# Patient Record
Sex: Male | Born: 1955 | Race: White | Hispanic: No | Marital: Single | Smoking: Never smoker
Health system: Southern US, Community
[De-identification: ages and names within clinical notes are randomized; demographics above are authoritative.]

## PROBLEM LIST (undated history)

## (undated) DIAGNOSIS — N289 Disorder of kidney and ureter, unspecified: Secondary | ICD-10-CM

## (undated) HISTORY — PX: CARDIAC SURGERY: SHX584

---

## 1998-10-16 ENCOUNTER — Ambulatory Visit (HOSPITAL_BASED_OUTPATIENT_CLINIC_OR_DEPARTMENT_OTHER): Admission: RE | Admit: 1998-10-16 | Discharge: 1998-10-16 | Payer: Self-pay | Admitting: Surgery

## 2004-10-20 ENCOUNTER — Emergency Department (HOSPITAL_COMMUNITY): Admission: EM | Admit: 2004-10-20 | Discharge: 2004-10-21 | Payer: Self-pay | Admitting: Emergency Medicine

## 2005-04-12 ENCOUNTER — Emergency Department (HOSPITAL_COMMUNITY): Admission: EM | Admit: 2005-04-12 | Discharge: 2005-04-13 | Payer: Self-pay | Admitting: Emergency Medicine

## 2006-11-10 ENCOUNTER — Emergency Department (HOSPITAL_COMMUNITY): Admission: EM | Admit: 2006-11-10 | Discharge: 2006-11-10 | Payer: Self-pay | Admitting: Emergency Medicine

## 2006-11-11 ENCOUNTER — Emergency Department (HOSPITAL_COMMUNITY): Admission: EM | Admit: 2006-11-11 | Discharge: 2006-11-11 | Payer: Self-pay | Admitting: Emergency Medicine

## 2006-11-13 ENCOUNTER — Emergency Department (HOSPITAL_COMMUNITY): Admission: EM | Admit: 2006-11-13 | Discharge: 2006-11-13 | Payer: Self-pay | Admitting: Family Medicine

## 2006-11-15 ENCOUNTER — Emergency Department (HOSPITAL_COMMUNITY): Admission: EM | Admit: 2006-11-15 | Discharge: 2006-11-15 | Payer: Self-pay | Admitting: Family Medicine

## 2006-11-21 ENCOUNTER — Emergency Department (HOSPITAL_COMMUNITY): Admission: EM | Admit: 2006-11-21 | Discharge: 2006-11-21 | Payer: Self-pay | Admitting: Family Medicine

## 2007-01-16 ENCOUNTER — Emergency Department (HOSPITAL_COMMUNITY): Admission: EM | Admit: 2007-01-16 | Discharge: 2007-01-16 | Payer: Self-pay | Admitting: Emergency Medicine

## 2007-01-31 ENCOUNTER — Emergency Department (HOSPITAL_COMMUNITY): Admission: EM | Admit: 2007-01-31 | Discharge: 2007-01-31 | Payer: Self-pay | Admitting: *Deleted

## 2008-11-26 ENCOUNTER — Ambulatory Visit (HOSPITAL_COMMUNITY): Admission: RE | Admit: 2008-11-26 | Discharge: 2008-11-26 | Payer: Self-pay | Admitting: General Surgery

## 2009-02-11 ENCOUNTER — Emergency Department (HOSPITAL_COMMUNITY): Admission: EM | Admit: 2009-02-11 | Discharge: 2009-02-11 | Payer: Self-pay | Admitting: General Surgery

## 2010-05-04 ENCOUNTER — Emergency Department (HOSPITAL_COMMUNITY): Admission: EM | Admit: 2010-05-04 | Discharge: 2010-05-04 | Payer: Self-pay | Admitting: Emergency Medicine

## 2011-01-18 LAB — COMPREHENSIVE METABOLIC PANEL
AST: 19 U/L (ref 0–37)
CO2: 29 mEq/L (ref 19–32)
Chloride: 108 mEq/L (ref 96–112)
Creatinine, Ser: 1.15 mg/dL (ref 0.4–1.5)
GFR calc Af Amer: >60 mL/min (ref >60)
GFR calc non Af Amer: >60 mL/min (ref >60)
Glucose, Bld: 103 mg/dL — ABNORMAL HIGH (ref 70–99)
Total Bilirubin: 0.9 mg/dL (ref 0.3–1.2)

## 2011-01-18 LAB — CBC
HCT: 45.7 % (ref 39.0–52.0)
Hemoglobin: 15.2 g/dL (ref 13.0–17.0)
MCHC: 33.2 g/dL (ref 30.0–36.0)
MCV: 100.2 fL — ABNORMAL HIGH (ref 78.0–100.0)
RBC: 4.56 MIL/uL (ref 4.22–5.81)
WBC: 6.1 10*3/uL (ref 4.0–10.5)

## 2011-01-18 LAB — DIFFERENTIAL
Basophils Absolute: 0 10*3/uL (ref 0.0–0.1)
Eosinophils Absolute: 0.1 10*3/uL (ref 0.0–0.7)
Eosinophils Relative: 1 % (ref 0–5)
Lymphocytes Relative: 24 % (ref 12–46)
Neutrophils Relative %: 65 % (ref 43–77)

## 2011-02-15 NOTE — Op Note (Signed)
[name], [name]             ACCOUNT NO.:  [id number]   MEDICAL RECORD NO.:  [id number]          PATIENT TYPE:  AMB   LOCATION:  DAY                          FACILITY[facility]   PHYSICIAN:  Juanetta Gosling, MDDATE OF BIRTH:  13-Apr-1956   DATE OF PROCEDURE:  11/26/2008  DATE OF DISCHARGE:                               OPERATIVE REPORT   PREOPERATIVE DIAGNOSIS:  Right inguinal hernia.   POSTOPERATIVE DIAGNOSIS:  Indirect right inguinal hernia.   PROCEDURE:  Right inguinal repair with extended Prolene hernia system  mesh.   SURGEON:  Dr. Harden Mo.   ASSISTANT:  None.   ANESTHESIA:  General.   FINDINGS:  A large indirect right inguinal hernia.   SPECIMENS:  None.   DRAINS:  None.   COMPLICATIONS:  None.   ESTIMATED BLOOD LOSS:  Minimal.   DISPOSITION:  To the recovery room in stable condition.   INDICATIONS:  [name] is a 55 year old male who has a prior history  of a left inguinal repair in 2000.  He saw me after a several month  history of a right groin bulge that he reports has been sore.  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  He is now very active and has no real cardiovascular symptoms  and did tolerate his hernia repair in 2000, without any difficulties.  On his exam, he had a well-healed left groin scar and a reducible  nontender right inguinal hernia.  We discussed an open right inguinal  hernia repair with mesh.   DESCRIPTION OF PROCEDURE:  After informed consent was obtained, the  patient was taken to the operating room.  He was administered 1 gram of  intravenous cefazolin.  He was placed under general anesthesia without  complication.  His right groin was then prepped and draped in a standard  sterile surgical fashion.  A surgical timeout was then performed.   A 5-cm right groin incision was then made.  Dissection  was carried out  down to the level of the external abdominal oblique.  The superficial  epigastric vein was ligated with 2-0 Vicryl ties.  The external  abdominal oblique was then entered through the external ring.  He was  noted to have a very large indirect inguinal hernia and also it was  noted that he had a very weak floor, but there was no true hernia in  this position.  The hernia sac was dissected free from the cord  structures.  This was then reduced back into the abdomen where he had a  very patulous internal ring.  I elected to place an extended Prolene  hernia system mesh.  I had opened a large, but I had realized this would  be too small for coverage over his pubic tubercle.  The extended hernia  system mesh was then inserted in the preperitoneal space after  developing this with a sponge.  The bottom portion of the bilayer was  then laid flat and  over Cooper's ligament.  I then closed the remainder  of his internal ring with a 2-0 Vicryl suture.  The top portion of the  bilayer was then laid flat.  This had good overlap over his pubic  tubercle and laterally as well.  This was sutured into position several  times at his inguinal ligament.  A T-cut was made in the mesh and it was  wrapped around the spermatic cord.  These ends were then tacked  together, as well as to the inguinal ligament in two positions.  This  was sutured near, but not into his pubic tubercle superiorly as a 2-0  Prolene stitch was placed to attach it to his internal oblique.  The  mesh was then laid flat underneath the external oblique laterally and  appeared to be in good position.  Hemostasis was observed.  The external  abdominal oblique was closed with 2-0 Vicryl.  Scarpa's was closed with  a 3-0 Vicryl in a running fashion.  The skin was then closed with a 4-0  Monocryl in a subcuticular fashion.  Dermabond was placed over the  wound.  Ten mL of quarter-percent Marcaine were infiltrated at the   wound, as well as doing an ilioinguinal block at the completion.  He  tolerated this procedure well.  He was extubated in the operating room  and transferred to the PACU in stable condition.      Juanetta Gosling, MD  Electronically Signed     MCW/MEDQ  D:  11/26/2008  T:  11/26/2008  Job:  [id number]

## 2011-06-27 ENCOUNTER — Encounter: Payer: Self-pay | Admitting: Internal Medicine

## 2011-06-27 ENCOUNTER — Inpatient Hospital Stay (HOSPITAL_COMMUNITY)
Admission: EM | Admit: 2011-06-27 | Discharge: 2011-06-30 | DRG: 694 | Disposition: A | Discharge status: Home / Self Care | Payer: Medicare Other | Source: Ambulatory Visit | Attending: Internal Medicine | Admitting: Internal Medicine

## 2011-06-27 ENCOUNTER — Emergency Department (HOSPITAL_COMMUNITY): Payer: Medicare Other

## 2011-06-27 DIAGNOSIS — D696 Thrombocytopenia, unspecified: Secondary | ICD-10-CM | POA: Diagnosis present

## 2011-06-27 DIAGNOSIS — R222 Localized swelling, mass and lump, trunk: Secondary | ICD-10-CM | POA: Diagnosis present

## 2011-06-27 DIAGNOSIS — E876 Hypokalemia: Secondary | ICD-10-CM | POA: Diagnosis present

## 2011-06-27 DIAGNOSIS — N200 Calculus of kidney: Principal | ICD-10-CM | POA: Diagnosis present

## 2011-06-27 DIAGNOSIS — N179 Acute kidney failure, unspecified: Secondary | ICD-10-CM | POA: Diagnosis present

## 2011-06-27 DIAGNOSIS — E860 Dehydration: Secondary | ICD-10-CM | POA: Diagnosis present

## 2011-06-27 LAB — DIFFERENTIAL
Basophils Absolute: 0 10*3/uL (ref 0.0–0.1)
Basophils Relative: 0 % (ref 0–1)
Neutro Abs: 12.2 10*3/uL — ABNORMAL HIGH (ref 1.7–7.7)
Neutrophils Relative %: 91 % — ABNORMAL HIGH (ref 43–77)

## 2011-06-27 LAB — URINALYSIS, ROUTINE W REFLEX MICROSCOPIC
Leukocytes, UA: NEGATIVE
Protein, ur: NEGATIVE mg/dL
Urobilinogen, UA: 0.2 mg/dL (ref 0.0–1.0)

## 2011-06-27 LAB — POCT I-STAT, CHEM 8
BUN: 26 mg/dL — ABNORMAL HIGH (ref 6–23)
Hemoglobin: 15.3 g/dL (ref 13.0–17.0)
Potassium: 3.4 mEq/L — ABNORMAL LOW (ref 3.5–5.1)
Sodium: 142 mEq/L (ref 135–145)
TCO2: 25 mmol/L (ref 0–100)

## 2011-06-27 LAB — CBC
Hemoglobin: 14.9 g/dL (ref 13.0–17.0)
RBC: 4.51 MIL/uL (ref 4.22–5.81)

## 2011-06-27 LAB — URIC ACID: Uric Acid, Serum: 5.3 mg/dL (ref 4.0–7.8)

## 2011-06-27 LAB — CALCIUM: Calcium: 9 mg/dL (ref 8.4–10.5)

## 2011-06-27 LAB — URINE MICROSCOPIC-ADD ON

## 2011-06-27 LAB — PHOSPHORUS: Phosphorus: 2.8 mg/dL (ref 2.3–4.6)

## 2011-06-27 NOTE — H&P (Signed)
Hospital Admission Note Date: 06/27/2011  Patient name:  [name]  Medical record number:  [id number] Date of birth:  [birth year]  Age: 55 y.o. Gender:  male PCP:   Patient is unable to provide the name but states that has a PCP in [place]. Medical Service:   Internal Medicine Teaching Service   Attending physician:  Dr. Dr. Josem Kaufmann First Contact:   Dr.  Dr. Clyde Lundborg  Pager:  Second Contact:   Dr.  Dr. Loistine Chance Pager: [phone] After Hours:    First Contact   Pager: [phone]      Second Contact  Pager: [phone]   Chief Complaint: Right flank pain for 4 days.  History of Present Illness: Patient is a 55 y.o. male with a PMHx of congenital heart disease, who presents to [facility] for evaluation of  Right flank pain that started 4 days ago. Pain is described as constant, colicky, without radiculopathy, 8/10 in intensity. Pain denies fever, chills, sweats, HA, CP, SOB, heart racing, dysuria, hematuria, difficulty initiating urinary stream or sensation of full bladder; constipation, diarrhea, melena, or leg swelling or rash. Patient endorses nausea and vomited 3 times with clear vomitus on a day of admission. He denies any similar symptoms in the past.  Current Outpatient Medications: Aleve 100 mg 1 tab PO PRN  Allergies: Review of patient's allergies indicates no known allergies.  Past Medical History: Congenital Hear disease . Patient states that "had a hole in his heart repaired" at age 50 y/o. History of "irregular heart beat" requiring a pacemaker. Pacemaker removed in late 1980's. Right indirect inguinal hernia repair in 2010.  Family History: Unremarkable. Both parents are deceased.  Social History:  Single, lives alone in a house; 10th grade education (can read); works as a Copy at a bar; no known exposure to chemicals; on a disability (unable to say what for); has Medicare/Medicaid; has a brother and sister (live in [place]). No alcohol, smoking or illicit drug use.  Review of  Systems: Constitutional: Denies fever, chills, diaphoresis, appetite change and fatigue.  HEENT: Denies photophobia, eye pain, redness, hearing loss, ear pain, congestion, sore throat, rhinorrhea, sneezing, mouth sores, trouble swallowing, neck pain, neck stiffness and tinnitus.  Respiratory: Denies SOB, DOE, cough, chest tightness, and wheezing.  Cardiovascular: Denies chest pain, palpitations and leg swelling.  Genitourinary: Denies dysuria, urgency, frequency, hematuria, flank pain and difficulty urinating.  Musculoskeletal: Denies myalgias, back pain, joint swelling, arthralgias and gait problem.  Skin: Denies pallor, rash and wound.  Neurological: Denies dizziness, seizures, syncope, weakness, light-headedness, numbness and headaches.  Hematological: Denies adenopathy. Easy bruising, personal or family bleeding history  Psychiatric/Behavioral: Denies suicidal ideation, mood changes, confusion, nervousness, sleep disturbance and agitation     Vital Signs:There were no vitals taken for this visit. T: 98.7 P: 65 BP: 156/88 RR: 18 O2 sat: 98%   Physical Exam: General Appearance:  Alert, cooperative, no distress, appears stated age   Head:  Microcephalic, without obvious abnormality, atraumatic   Eyes:  PERRL, conjunctiva/corneas clear, EOM's intact, fundi  benign, both eyes   Ears:  Normal TM's and external ear canals, both ears   Nose:  Nares normal, septum midline, mucosa normal, no drainage  or sinus tenderness   Throat:  Lips, mucosa, and tongue normal; teeth and gums normal   Neck:  Supple, symmetrical, trachea midline, no adenopathy;  thyroid: No enlargement/tenderness/nodules; no carotid  bruit or JVD   Back:  Symmetric, no curvature, ROM normal, no CVA tenderness   Lungs:  Clear  to auscultation bilaterally, respirations unlabored   Chest wall:  No tenderness or deformity   Heart:  Regular rate and rhythm, systolic murmur 4/5 at both left and right upper sternal borders; no  rub   Abdomen:  Soft, non-tender, bowel sounds active all four quadrants,  no masses, no organomegaly ; R CVA TTP present.  Extremities:  Full ROM proximally and distally of upper and lower extremities bilaterally.  Pulses:  2+ and symmetric all extremities   Skin:  Skin color, texture, turgor normal, no rashes or lesions   Neuro:   CN II-XII intact; sensory exam without deficits; no Babinski bilaterally; DTR's 2+/4 bilaterally. No focal deficits.  Lymph nodes:  Cervical, supraclavicular, and axillary nodes normal      Lab results: Basic Metabolic Panel:  Basename 06/27/11 0600  NA 142  K 3.4*  CL 104  CO2 --  GLUCOSE 154*  BUN 26*  CREATININE 1.50*  CALCIUM --  MG --  PHOS --   CBC:  Basename 06/27/11 0600 06/27/11 0550  WBC -- 13.4*  NEUTROABS -- 12.2*  HGB 15.3 14.9  HCT 45.0 41.8  MCV -- 92.7  PLT -- 134*    Misc. Labs: Imaging results:  Ct Abdomen Pelvis Wo Contrast  06/27/2011  *RADIOLOGY REPORT*  Clinical Data: Right flank pain, dysuria  CT ABDOMEN AND PELVIS WITHOUT CONTRAST  Technique:  Multidetector CT imaging of the abdomen and pelvis was performed following the standard protocol without intravenous contrast.  Comparison: None.  Findings: The lung bases are clear other than mild atelectasis. However, there is an oval soft tissue mass adjacent to the descending thoracic aorta, possibly attached to the crus of the left hemidiaphragm.  A lung lesion cannot be excluded versus adenopathy.  This oval soft tissue mass measures 25 x 18 mm with an attenuation of 41 HU.  This appears to be separate from the stomach, although there may be a small hiatal hernia immediately adjacent.  PET-CT may be helpful to assess for metabolic activity within this lesion.  The liver is unremarkable in the unenhanced state.  There is higher attenuation within the gallbladder which may represent noncalcified gallstones and/or polyps and sludge. The pancreas is normal in size and the pancreatic  duct is not dilated.  The adrenal glands and spleen are unremarkable.  A large cyst emanates from the upper pole of the left kidney measuring 54 mm in diameter.  A tiny 2 mm right lower pole renal calculus is present.  However, there is slight fullness of the right pelvocaliceal system.  The right ureter remains slightly dilated to a point of partial obstruction by a 3 mm distal right ureteral calculus only a few centimeters from the right UV junction.  The left ureter is normal in caliber and no left renal calculi are seen.  The abdominal aorta is normal in caliber.  No additional adenopathy is evident.  The urinary bladder is not well distended but is unremarkable.  The prostate is within normal limits in size.  No pelvic mass or fluid is seen.22 there is a mild lumbar scoliosis present.  No acute bony abnormality is seen.  IMPRESSION:  1.  Low grade obstruction of the right kidney by a 3 mm distal right ureteral calculus near the right UV junction. 2.  2 mm nonobstructing right lower pole renal calculus. 3.  Oval soft tissue mass at the medial left lung base as described above.  Consider PET CT to assess for metabolic activity. 4.  Opacities  within the gallbladder without calcification may represent noncalcified gallstones, polyps, and/or sludge.  Original Report Authenticated By: Juline Patch, M.D.    Assessment & Plan: 1.  Mr. Caul 55 y/o pleasant man presents with emesis and right flank pain CT of abdomen with low grade obstruction of the right kidney with minimal hydronephrosis. - Admit to medical floor -UA+micro -uric acid level -NPO  Due to emesis -IVF: NS at 125 cc/hr -Morphine IV PRN for pain with hold parameters -Zofran IV PRN for N/V -daily I/O's -Ancef 1 gm Iv q 8 hrs for an empiric treatment -Consider urology consult if no improvement or worsened symptoms.  2. AKI, with mild elevation in creatinine. This is most likely due to problem#1.  3. Incidental finding of left lung base  soft tissue mass. CT findings were reviewed with Dr. Gery Pray (Radiology). Patient is a nonsmoker, without a known exposure to chemicals and negative FMHx for malignancy.  It was felt that the patient would not follow up with a work up on an outpatient basis, therefore, will order a PET scan at [facility] for further evaluation while the patient is being in the hospital.  4. Heart murmur with a hsitory of congenital heart diease and history of possible cardiac dysrhythmias. Will need to obtain patient's medical records. Will contact the patient's sister since patient was unable to provide any information.  DVT PPX - SCD's    Deatra Robinson M.D. (PGY3):  ____________________________________    Date/ Time:     ____________________________________     I have seen and examined the patient. I reviewed the resident/fellow note and agree with the findings and plan of care as documented. My additions and revisions are included.   Signature:  ____________________________________________     Internal Medicine Teaching Service Attending    Date:    ____________________________________________

## 2011-06-28 DIAGNOSIS — N200 Calculus of kidney: Secondary | ICD-10-CM

## 2011-06-28 DIAGNOSIS — R222 Localized swelling, mass and lump, trunk: Secondary | ICD-10-CM

## 2011-06-28 LAB — URINE CULTURE
Colony Count: NO GROWTH
Culture: NO GROWTH

## 2011-06-28 LAB — BASIC METABOLIC PANEL
BUN: 17 mg/dL (ref 6–23)
CO2: 29 mEq/L (ref 19–32)
Chloride: 105 mEq/L (ref 96–112)
Creatinine, Ser: 2.08 mg/dL — ABNORMAL HIGH (ref 0.50–1.35)
GFR calc Af Amer: 40 mL/min — ABNORMAL LOW (ref >60)

## 2011-06-28 LAB — DIFFERENTIAL
Eosinophils Relative: 0 % (ref 0–5)
Lymphocytes Relative: 13 % (ref 12–46)
Lymphs Abs: 1.2 10*3/uL (ref 0.7–4.0)
Monocytes Absolute: 1 10*3/uL (ref 0.1–1.0)

## 2011-06-28 LAB — CBC
HCT: 38 % — ABNORMAL LOW (ref 39.0–52.0)
MCV: 92.7 fL (ref 78.0–100.0)
RDW: 11.6 % (ref 11.5–15.5)
WBC: 9.7 10*3/uL (ref 4.0–10.5)

## 2011-06-29 ENCOUNTER — Encounter (HOSPITAL_COMMUNITY)
Admit: 2011-06-29 | Discharge: 2011-06-29 | Disposition: A | Discharge status: Home / Self Care | Payer: Medicare Other | Attending: Internal Medicine | Admitting: Internal Medicine

## 2011-06-29 DIAGNOSIS — J90 Pleural effusion, not elsewhere classified: Secondary | ICD-10-CM | POA: Insufficient documentation

## 2011-06-29 DIAGNOSIS — R222 Localized swelling, mass and lump, trunk: Secondary | ICD-10-CM | POA: Insufficient documentation

## 2011-06-29 DIAGNOSIS — J984 Other disorders of lung: Secondary | ICD-10-CM | POA: Insufficient documentation

## 2011-06-29 DIAGNOSIS — K8020 Calculus of gallbladder without cholecystitis without obstruction: Secondary | ICD-10-CM | POA: Insufficient documentation

## 2011-06-29 DIAGNOSIS — N201 Calculus of ureter: Secondary | ICD-10-CM | POA: Insufficient documentation

## 2011-06-29 LAB — BASIC METABOLIC PANEL
CO2: 24 mEq/L (ref 19–32)
Chloride: 105 mEq/L (ref 96–112)
Potassium: 3.8 mEq/L (ref 3.5–5.1)
Sodium: 137 mEq/L (ref 135–145)

## 2011-06-29 LAB — GLUCOSE, CAPILLARY: Glucose-Capillary: 109 mg/dL — ABNORMAL HIGH (ref 70–99)

## 2011-06-29 LAB — CBC
Platelets: 107 10*3/uL — ABNORMAL LOW (ref 150–400)
RBC: 4.29 MIL/uL (ref 4.22–5.81)
WBC: 10.7 10*3/uL — ABNORMAL HIGH (ref 4.0–10.5)

## 2011-06-29 MED ORDER — FLUDEOXYGLUCOSE F - 18 (FDG) INJECTION
17.2000 | Freq: Once | INTRAVENOUS | Status: AC | PRN
  Administered 2011-06-29: 17.2 via INTRAVENOUS

## 2011-06-30 DIAGNOSIS — N200 Calculus of kidney: Secondary | ICD-10-CM

## 2011-06-30 DIAGNOSIS — R222 Localized swelling, mass and lump, trunk: Secondary | ICD-10-CM

## 2011-06-30 LAB — BASIC METABOLIC PANEL
BUN: 10 mg/dL (ref 6–23)
Chloride: 110 mEq/L (ref 96–112)
GFR calc Af Amer: >60 mL/min (ref >60)
Glucose, Bld: 94 mg/dL (ref 70–99)
Potassium: 3.6 mEq/L (ref 3.5–5.1)
Sodium: 141 mEq/L (ref 135–145)

## 2011-06-30 LAB — CBC
HCT: 32.2 % — ABNORMAL LOW (ref 39.0–52.0)
Hemoglobin: 11.3 g/dL — ABNORMAL LOW (ref 13.0–17.0)
WBC: 5.5 10*3/uL (ref 4.0–10.5)

## 2011-07-14 NOTE — Discharge Summary (Signed)
NAMEABDOULIE, TIERCE NO.:  [id number]  MEDICAL RECORD NO.:  [id number]  LOCATION:  3021                         FACILITY:  [facility]  PHYSICIAN:  Doneen Poisson, MD     DATE OF BIRTH:  [birth year]  DATE OF ADMISSION:  06/27/2011 DATE OF DISCHARGE:  06/30/2011                              DISCHARGE SUMMARY   DISCHARGE DIAGNOSES: 1. Nephrolithiasis. 2. Acute kidney injury resolved.   DISCHARGE MEDICATIONS: 1. Flomax 0.4 mg 1 tablet once daily. 2. Zofran 4 mg q.6 h. p.r.n. for nausea. 3. Vicodin 7.5 mg/325 mg 1 tablet p.o. q.6 h. p.r.n. pain.  DISPOSITION AND FOLLOWUP:  Mr. Moates will follow up with his primary care physician Dr. Schuyler Amor on July 07, 2011, at 2:15 p.m.  He will also need  a CT of the abdomen with contrast in 3 months to follow up on a subxiphoid  soft tissue mass that was discovered incidentally on CT.  PROCEDURES PERFORMED DURING THIS ADMISSION: 1. CT abdomen and pelvis without contrast showing a low-grade     obstruction of the right kidney by a 3 mm distal right ureteral     calculus at the right UV junction.  A 2-mm nonobstructing     lower pole renal calculus. Oval soft tissue mass at the     medial left lung base.  Opacities within the gallbladder     without calcifications. 2. PET scan revealing a soft tissue density lesion at the inferior     left hemithorax that is not significantly hypermetabolic.     Differential considerations include epiphrenic diverticulum, GI     tract duplication cyst or fibrous tumor of the pleural.     Slight progression of a small stone to the right ureter, testicular     junction was increased due to right perirenal edema and new cul-de-     sac fluid.  Incidental findings including gallstones and trace     bilateral pleural effusions.   HISTORY OF PRESENT ILLNESS:  Mr. Eads is a 55 year old man with a past medical history of a congenital heart defect who presented to [facility] for evaluation of  right flank pain that started approximately 4 days ago.  Pain was intermittent, colicky, 8/10 in intensity, radiating  towards the groin on the right.  He denied fevers, chills, sweats,  headache, chest pain, shortness of breath, urinary symptoms, dysuria,  hematuria, difficulty initiating a urinary stream or sensation of a  full bladder.  He complained of nausea and vomiting.  Vomiting was approximately 3 times and was nonbloody.  He denies similar symptoms  in the past.  PAST MEDICAL HISTORY:  Significant for a congenital heart defect.  PHYSICAL EXAMINATION: VITAL SIGNS:  Temperature 98.7, pulse 55, blood pressure 156/88,  respiratory rate 18, O2 saturation 98%. GENERAL:  Alert, cooperative, no acute distress, appears stated age. HEENT:  Microcephalic, atraumatic.  Pupils round and reactive to  light.  Throat exam, lips and oral mucosa appeared dry. NECK:  Supple and symmetrical.  Trachea midline, tenderness on right. LUNGS:  Clear to auscultation bilaterally. HEART:  Regular rate and rhythm.  2/6 systolic murmur located at both the left and right upper sternal borders.  ABDOMEN:  Soft, nondistended.  Bowel sounds active in all 4 quadrants. No masses, or organomegaly.  Right CVA tenderness. EXTREMITIES:  No edema or tenderness.  Pulses 2+ and symmetric in all extremities. NEUROLOGIC:  Cranial nerves II through XII grossly intact.  Sensory exam without deficits. LYMPH NODES:  Cervical, supraclavicular and axillary nodes all normal.  LABS ON ADMISSION:  Sodium 142, potassium 3.4, chloride 104, glucose 154,  BUN 26, creatinine 1.50.  White blood cell count 13.4, hemoglobin 14.9,  hematocrit 41.8, platelets 134.  HOSPITAL COURSE: 1. Right nephrolithiasis. Mr. Tweed was found to have nephrolithiasis.  Antibiotic therpay with Ancef was initiated but discontinued since he had  no signs of infection.  He had significant pain and nausea which was well  controlled on narcotics and  zofran. He did not pass the stone therefore  no chemical analysis was performed. He was to continue to strain his urine in hopes of obtaining the stone for further testing.  He will follow up with  his PCP on October 4th.  He wase discharged with Vicodin and Zofran for  symptom control.  2. Acute kidney injury resolved. Likely due to volume depletion in the  setting of diarrhea and emesis. Creatinine on admission was 1.50 which  was elevated compared to baseline (1.1 in May 2011).  He received normal  saline for volume resuscitation and his creatinine improved to baseline  on the day of discharge.  3. Incidental finding of a left lung base soft tissue mass on CT scan.  He  is a nonsmoker without a known exposure to chemicals and negative family  history for malignancy.  It was felt that he would not follow up as an  outpatient, therefore, a PET scan was performed which showed insignificant  hypermetabolic activity.  The differential included epiphrenic diverticulum,  GI tract duplication cyst or fibrous tumor of the pleural.  He was  instructed to follow up in 3 months for a CT with contrast of the abdomen  looking for a change in size of the lesion.  He will likely require serial  CT scans for a period of at least 2 years.  VITAL SIGNS AT DISCHARGE:  Temperature 98.4, blood pressure 144/68, pulse 71, respirations 18, O2 saturation 99% on room air.  LABS AT DISCHARGE:  Sodium 141, potassium 3.6, chloride 110, CO2 26, glucose 94, BUN 10, creatinine 1.04, calcium 8.1.  white blood cell  count 5.5, hemoglobin 11.3, hematocrit 32.2, platelets 85,000.   ______________________________ Almyra Deforest, MD   ______________________________ Doneen Poisson, MD   JI/MEDQ  D:  06/30/2011  T:  06/30/2011  Job:  [id number]  cc:   Dr. Mikeal Hawthorne  Electronically Signed by Almyra Deforest MD on 07/08/2011 09:53:40 PM Electronically Signed by Doneen Poisson  on 07/14/2011 08:51:09 AM

## 2011-07-19 ENCOUNTER — Emergency Department (HOSPITAL_COMMUNITY): Payer: Medicare Other

## 2011-07-19 ENCOUNTER — Emergency Department (HOSPITAL_COMMUNITY)
Admission: EM | Admit: 2011-07-19 | Discharge: 2011-07-19 | Disposition: A | Discharge status: Home / Self Care | Payer: Medicare Other | Attending: Emergency Medicine | Admitting: Emergency Medicine

## 2011-07-19 DIAGNOSIS — R110 Nausea: Secondary | ICD-10-CM | POA: Insufficient documentation

## 2011-07-19 DIAGNOSIS — R42 Dizziness and giddiness: Secondary | ICD-10-CM | POA: Insufficient documentation

## 2011-07-19 DIAGNOSIS — E860 Dehydration: Secondary | ICD-10-CM | POA: Insufficient documentation

## 2011-07-19 LAB — COMPREHENSIVE METABOLIC PANEL
AST: 12 U/L (ref 0–37)
Albumin: 4.1 g/dL (ref 3.5–5.2)
Chloride: 104 mEq/L (ref 96–112)
Creatinine, Ser: 0.98 mg/dL (ref 0.50–1.35)
Potassium: 4 mEq/L (ref 3.5–5.1)
Total Bilirubin: 0.3 mg/dL (ref 0.3–1.2)
Total Protein: 7.2 g/dL (ref 6.0–8.3)

## 2011-07-19 LAB — CBC
MCHC: 35.6 g/dL (ref 30.0–36.0)
MCV: 93.6 fL (ref 78.0–100.0)
Platelets: 192 10*3/uL (ref 150–400)
RDW: 11.6 % (ref 11.5–15.5)
WBC: 6.2 10*3/uL (ref 4.0–10.5)

## 2011-07-19 LAB — DIFFERENTIAL
Basophils Absolute: 0 10*3/uL (ref 0.0–0.1)
Eosinophils Absolute: 0 10*3/uL (ref 0.0–0.7)
Eosinophils Relative: 0 % (ref 0–5)
Monocytes Absolute: 0.4 10*3/uL (ref 0.1–1.0)

## 2013-02-15 ENCOUNTER — Emergency Department (HOSPITAL_COMMUNITY)
Admission: EM | Admit: 2013-02-15 | Discharge: 2013-02-15 | Disposition: A | Discharge status: Home / Self Care | Payer: Medicare Other | Attending: Emergency Medicine | Admitting: Emergency Medicine

## 2013-02-15 ENCOUNTER — Encounter (HOSPITAL_COMMUNITY): Payer: Self-pay | Admitting: Cardiology

## 2013-02-15 DIAGNOSIS — Z79899 Other long term (current) drug therapy: Secondary | ICD-10-CM | POA: Insufficient documentation

## 2013-02-15 DIAGNOSIS — K529 Noninfective gastroenteritis and colitis, unspecified: Secondary | ICD-10-CM

## 2013-02-15 DIAGNOSIS — K5289 Other specified noninfective gastroenteritis and colitis: Secondary | ICD-10-CM | POA: Insufficient documentation

## 2013-02-15 LAB — URINALYSIS, ROUTINE W REFLEX MICROSCOPIC
Hgb urine dipstick: NEGATIVE
Nitrite: NEGATIVE
Protein, ur: NEGATIVE mg/dL
Specific Gravity, Urine: 1.014 (ref 1.005–1.030)
Urobilinogen, UA: 0.2 mg/dL (ref 0.0–1.0)

## 2013-02-15 LAB — CBC WITH DIFFERENTIAL/PLATELET
Eosinophils Absolute: 0 10*3/uL (ref 0.0–0.7)
Eosinophils Relative: 1 % (ref 0–5)
HCT: 41.9 % (ref 39.0–52.0)
Lymphocytes Relative: 26 % (ref 12–46)
Lymphs Abs: 1.6 10*3/uL (ref 0.7–4.0)
MCH: 34 pg (ref 26.0–34.0)
MCV: 94.4 fL (ref 78.0–100.0)
Monocytes Absolute: 0.5 10*3/uL (ref 0.1–1.0)
Monocytes Relative: 8 % (ref 3–12)
Platelets: 137 10*3/uL — ABNORMAL LOW (ref 150–400)
RBC: 4.44 MIL/uL (ref 4.22–5.81)
WBC: 6 10*3/uL (ref 4.0–10.5)

## 2013-02-15 LAB — COMPREHENSIVE METABOLIC PANEL
BUN: 17 mg/dL (ref 6–23)
CO2: 24 mEq/L (ref 19–32)
Calcium: 9.6 mg/dL (ref 8.4–10.5)
Creatinine, Ser: 1.06 mg/dL (ref 0.50–1.35)
GFR calc Af Amer: 88 mL/min — ABNORMAL LOW (ref >90)
GFR calc non Af Amer: 76 mL/min — ABNORMAL LOW (ref >90)
Glucose, Bld: 101 mg/dL — ABNORMAL HIGH (ref 70–99)
Sodium: 140 mEq/L (ref 135–145)
Total Protein: 7.1 g/dL (ref 6.0–8.3)

## 2013-02-15 LAB — LIPASE, BLOOD: Lipase: 38 U/L (ref 11–59)

## 2013-02-15 MED ORDER — PROMETHAZINE HCL 25 MG RE SUPP: 25.0000 mg | Freq: Four times a day (QID) | RECTAL | Status: DC | PRN

## 2013-02-15 MED ORDER — SODIUM CHLORIDE 0.9 % IV BOLUS (SEPSIS)
1000.0000 mL | Freq: Once | INTRAVENOUS | Status: AC
  Administered 2013-02-15: 1000 mL via INTRAVENOUS

## 2013-02-15 MED ORDER — ONDANSETRON HCL 4 MG/2ML IJ SOLN
4.0000 mg | Freq: Once | INTRAMUSCULAR | Status: AC
  Administered 2013-02-15: 4 mg via INTRAVENOUS
  Filled 2013-02-15: qty 2

## 2013-02-15 NOTE — ED Provider Notes (Signed)
History     CSN: [id number]  Arrival date & time 02/15/13  1346   First MD Initiated Contact with Patient 02/15/13 1713      Chief Complaint  Patient presents with  . Emesis    (Consider location/radiation/quality/duration/timing/severity/associated sxs/prior treatment) HPI..... 2-3 episodes of vomiting the past week. No abdominal pain.  Nothing makes symptoms better or worse. Severity is mild. No radiation of pain.  Has history of kidney stone.  History reviewed. No pertinent past medical history.  Past Surgical History  Procedure Laterality Date  . Cardiac surgery      childhood    History reviewed. No pertinent family history.  History  Substance Use Topics  . Smoking status: Never Smoker   . Smokeless tobacco: Not on file  . Alcohol Use: No      Review of Systems  All other systems reviewed and are negative.    Allergies  Review of patient's allergies indicates no known allergies.  Home Medications   Current Outpatient Rx  Name  Route  Sig  Dispense  Refill  . clonazePAM (KLONOPIN) 0.5 MG tablet   Oral   Take 0.5 mg by mouth 2 (two) times daily.           BP 120/79  Pulse 86  Temp(Src) 98.6 F (37 C) (Oral)  Resp 18  SpO2 97%  Physical Exam  Nursing note and vitals reviewed. Constitutional: He is oriented to person, place, and time.  No acute distress  HENT:  Head: Normocephalic and atraumatic.  Eyes: Conjunctivae and EOM are normal. Pupils are equal, round, and reactive to light.  Neck: Normal range of motion. Neck supple.  Cardiovascular: Normal rate, regular rhythm and normal heart sounds.   Pulmonary/Chest: Effort normal and breath sounds normal.  Abdominal: Soft. Bowel sounds are normal.  Musculoskeletal: Normal range of motion.  Neurological: He is alert and oriented to person, place, and time.  Skin: Skin is warm and dry.  Psychiatric: He has a normal mood and affect.    ED Course  Procedures (including critical care  time)  Labs Reviewed  CBC WITH DIFFERENTIAL - Abnormal; Notable for the following:    Platelets 137 (*)    All other components within normal limits  COMPREHENSIVE METABOLIC PANEL - Abnormal; Notable for the following:    Glucose, Bld 101 (*)    GFR calc non Af Amer 76 (*)    GFR calc Af Amer 88 (*)    All other components within normal limits  LIPASE, BLOOD  URINALYSIS, ROUTINE W REFLEX MICROSCOPIC   No results found.   No diagnosis found.    MDM  Will hydrate, IV Zofran, screening labs. Patient appears nontoxic  Patient rechecked at discharge. He is hemodynamically stable. Feels better      Donnetta Hutching, MD 02/16/13 1231

## 2013-02-15 NOTE — ED Notes (Signed)
Pt reports a couple of episodes of vomiting over the past week. Denies any urinary symptoms or flank pain. Denies any abd pain or tenderness. States he thinks that he had a kidney stone in the past.

## 2013-05-17 ENCOUNTER — Emergency Department (HOSPITAL_COMMUNITY)
Admission: EM | Admit: 2013-05-17 | Discharge: 2013-05-17 | Disposition: A | Discharge status: Home / Self Care | Payer: Medicare Other | Attending: Emergency Medicine | Admitting: Emergency Medicine

## 2013-05-17 ENCOUNTER — Encounter (HOSPITAL_COMMUNITY): Payer: Self-pay | Admitting: Emergency Medicine

## 2013-05-17 DIAGNOSIS — R112 Nausea with vomiting, unspecified: Secondary | ICD-10-CM | POA: Insufficient documentation

## 2013-05-17 DIAGNOSIS — K921 Melena: Secondary | ICD-10-CM | POA: Insufficient documentation

## 2013-05-17 DIAGNOSIS — R110 Nausea: Secondary | ICD-10-CM

## 2013-05-17 DIAGNOSIS — Z79899 Other long term (current) drug therapy: Secondary | ICD-10-CM | POA: Insufficient documentation

## 2013-05-17 LAB — BASIC METABOLIC PANEL
Calcium: 9.8 mg/dL (ref 8.4–10.5)
GFR calc non Af Amer: 71 mL/min — ABNORMAL LOW (ref >90)
Potassium: 4 mEq/L (ref 3.5–5.1)
Sodium: 143 mEq/L (ref 135–145)

## 2013-05-17 LAB — CBC WITH DIFFERENTIAL/PLATELET
Basophils Absolute: 0 10*3/uL (ref 0.0–0.1)
Eosinophils Absolute: 0 10*3/uL (ref 0.0–0.7)
Eosinophils Relative: 0 % (ref 0–5)
Lymphocytes Relative: 23 % (ref 12–46)
Lymphs Abs: 1 10*3/uL (ref 0.7–4.0)
MCH: 33.8 pg (ref 26.0–34.0)
MCV: 93.1 fL (ref 78.0–100.0)
Neutrophils Relative %: 68 % (ref 43–77)
Platelets: 124 10*3/uL — ABNORMAL LOW (ref 150–400)
RBC: 4.32 MIL/uL (ref 4.22–5.81)
RDW: 11.6 % (ref 11.5–15.5)
WBC: 4.6 10*3/uL (ref 4.0–10.5)

## 2013-05-17 MED ORDER — ONDANSETRON HCL 4 MG PO TABS: 4.0000 mg | ORAL_TABLET | Freq: Four times a day (QID) | ORAL | Status: DC

## 2013-05-17 MED ORDER — ONDANSETRON 4 MG PO TBDP
4.0000 mg | ORAL_TABLET | Freq: Once | ORAL | Status: AC
  Administered 2013-05-17: 4 mg via ORAL
  Filled 2013-05-17: qty 1

## 2013-05-17 NOTE — ED Notes (Signed)
Pt reports for the last week he has had intermittent episodes of vomiting and noting bright red blood in two stools. Pt denies pain or fever. Pt alert, oriented x4, NAD at present.

## 2013-05-17 NOTE — ED Provider Notes (Signed)
CSN: [id number]     Arrival date & time 05/17/13  1247 History     First MD Initiated Contact with Patient 05/17/13 1321     Chief Complaint  Patient presents with  . Melena  . Emesis    HPI   Patient states "I think I got a bug". She's had nausea for the last 3-4 days. He vomited yesterday and today. There was no blood. For about a week he's been drinking "cherry flavor crystallite". She's been drinking this most days. His stool to the right a few days ago. He states it is a solid stool. It is not black. He has not had diarrhea. He hasn't have abdominal pain. He has mild nausea now. He vomited once 2 days ago and once yesterday not any today. No history of GI bleeding or liver disease. History reviewed. No pertinent past medical history. Past Surgical History  Procedure Laterality Date  . Cardiac surgery      childhood   History reviewed. No pertinent family history. History  Substance Use Topics  . Smoking status: Never Smoker   . Smokeless tobacco: Not on file  . Alcohol Use: No    Review of Systems  Constitutional: Negative for fever, chills, diaphoresis, appetite change and fatigue.  HENT: Negative for sore throat, mouth sores and trouble swallowing.   Eyes: Negative for visual disturbance.  Respiratory: Negative for cough, chest tightness, shortness of breath and wheezing.   Cardiovascular: Negative for chest pain.  Gastrointestinal: Positive for nausea. Negative for vomiting, abdominal pain, diarrhea, blood in stool and abdominal distention.  Endocrine: Negative for polydipsia, polyphagia and polyuria.  Genitourinary: Negative for dysuria, frequency and hematuria.  Musculoskeletal: Negative for gait problem.  Skin: Negative for color change, pallor and rash.  Neurological: Negative for dizziness, syncope, light-headedness and headaches.  Hematological: Does not bruise/bleed easily.  Psychiatric/Behavioral: Negative for behavioral problems and confusion.     Allergies  Review of patient's allergies indicates no known allergies.  Home Medications   Current Outpatient Rx  Name  Route  Sig  Dispense  Refill  . clonazePAM (KLONOPIN) 0.5 MG tablet   Oral   Take 0.5 mg by mouth 2 (two) times daily.          BP 107/74  Pulse 81  Temp(Src) 97.9 F (36.6 C) (Oral)  Resp 16  Wt 77 lb (34.927 kg)  SpO2 97% Physical Exam  Constitutional: He is oriented to person, place, and time. He appears well-developed and well-nourished. No distress.  HENT:  Microcephalic.  Eyes: Conjunctivae are normal. Pupils are equal, round, and reactive to light. No scleral icterus.  Conjunctiva are not pale no scleral icterus  Neck: Normal range of motion. Neck supple. No thyromegaly present.  Cardiovascular: Normal rate and regular rhythm.  Exam reveals no gallop and no friction rub.   No murmur heard. Pulmonary/Chest: Effort normal and breath sounds normal. No respiratory distress. He has no wheezes. He has no rales.  Abdominal: Soft. Bowel sounds are normal. He exhibits no distension. There is no tenderness. There is no rebound.  Abdomen soft nondistended. Rectal exam is guaiac negative.  Musculoskeletal: Normal range of motion.  Neurological: He is alert and oriented to person, place, and time.  Skin: Skin is warm and dry. No rash noted.  Psychiatric: He has a normal mood and affect. His behavior is normal.    ED Course   Procedures (including critical care time)  Labs Reviewed  CBC WITH DIFFERENTIAL  BASIC METABOLIC PANEL  No results found. No diagnosis found.  MDM  We'll check a blood count. No signs of hemorrhaging now. Not tender or painful as I would expect with peptic ulcer disease.. The red color to explain by his dietary intake and is not blood. Plan: symptom control of his nausea. Hb 14.6 normal platelet count normal electrolytes. Hs is asking for water.  His nausea resolved diagnosis nausea.    Claudean Kinds, MD 05/17/13  [phone]

## 2013-05-17 NOTE — ED Notes (Signed)
Pt reports 1 episode of nausea on 8/6. Denies nausea/emesis since. Pt also reports 2 stools with "dark red stools" last week. Reports normal BM since. Denies pain. AO x4.

## 2013-06-29 ENCOUNTER — Encounter (HOSPITAL_COMMUNITY): Payer: Self-pay | Admitting: *Deleted

## 2013-06-29 ENCOUNTER — Emergency Department (HOSPITAL_COMMUNITY)
Admission: EM | Admit: 2013-06-29 | Discharge: 2013-06-29 | Disposition: A | Discharge status: Home / Self Care | Payer: Medicare Other | Attending: Emergency Medicine | Admitting: Emergency Medicine

## 2013-06-29 DIAGNOSIS — Z79899 Other long term (current) drug therapy: Secondary | ICD-10-CM | POA: Insufficient documentation

## 2013-06-29 DIAGNOSIS — M545 Low back pain, unspecified: Secondary | ICD-10-CM | POA: Insufficient documentation

## 2013-06-29 DIAGNOSIS — R112 Nausea with vomiting, unspecified: Secondary | ICD-10-CM | POA: Insufficient documentation

## 2013-06-29 DIAGNOSIS — Z87442 Personal history of urinary calculi: Secondary | ICD-10-CM | POA: Insufficient documentation

## 2013-06-29 HISTORY — DX: Disorder of kidney and ureter, unspecified: N28.9

## 2013-06-29 LAB — COMPREHENSIVE METABOLIC PANEL
ALT: 14 U/L (ref 0–53)
Alkaline Phosphatase: 58 U/L (ref 39–117)
CO2: 28 mEq/L (ref 19–32)
Chloride: 100 mEq/L (ref 96–112)
GFR calc Af Amer: 73 mL/min — ABNORMAL LOW (ref >90)
Glucose, Bld: 108 mg/dL — ABNORMAL HIGH (ref 70–99)
Potassium: 4 mEq/L (ref 3.5–5.1)
Sodium: 138 mEq/L (ref 135–145)
Total Bilirubin: 0.6 mg/dL (ref 0.3–1.2)
Total Protein: 7.8 g/dL (ref 6.0–8.3)

## 2013-06-29 LAB — URINALYSIS, ROUTINE W REFLEX MICROSCOPIC
Bilirubin Urine: NEGATIVE
Ketones, ur: 15 mg/dL — AB
Nitrite: NEGATIVE
Protein, ur: NEGATIVE mg/dL
pH: 6 (ref 5.0–8.0)

## 2013-06-29 LAB — CBC WITH DIFFERENTIAL/PLATELET
Hemoglobin: 15.5 g/dL (ref 13.0–17.0)
Lymphocytes Relative: 9 % — ABNORMAL LOW (ref 12–46)
Lymphs Abs: 0.9 10*3/uL (ref 0.7–4.0)
Monocytes Relative: 5 % (ref 3–12)
Neutro Abs: 7.8 10*3/uL — ABNORMAL HIGH (ref 1.7–7.7)
Neutrophils Relative %: 85 % — ABNORMAL HIGH (ref 43–77)
Platelets: 137 10*3/uL — ABNORMAL LOW (ref 150–400)
RBC: 4.55 MIL/uL (ref 4.22–5.81)
WBC: 9.2 10*3/uL (ref 4.0–10.5)

## 2013-06-29 MED ORDER — IBUPROFEN 400 MG PO TABS
600.0000 mg | ORAL_TABLET | Freq: Once | ORAL | Status: AC
  Administered 2013-06-29: 600 mg via ORAL
  Filled 2013-06-29 (×2): qty 1

## 2013-06-29 MED ORDER — IBUPROFEN 200 MG PO TABS: 600.0000 mg | ORAL_TABLET | Freq: Four times a day (QID) | ORAL | Status: AC | PRN

## 2013-06-29 NOTE — ED Notes (Signed)
Pt knows that urine is needed. Pt is trying to void at this time. 

## 2013-06-29 NOTE — ED Notes (Signed)
Pt with R sided flank pain since last night accompanied by diaphoresis and emesis.  Denies hematuria.

## 2013-06-29 NOTE — ED Provider Notes (Signed)
CSN: [id number]     Arrival date & time 06/29/13  1323 History   First MD Initiated Contact with Patient 06/29/13 1548     Chief Complaint  Patient presents with  . Flank Pain   (Consider location/radiation/quality/duration/timing/severity/associated sxs/prior Treatment) Patient is a 57 y.o. male presenting with flank pain. The history is provided by the patient. No language interpreter was used.  Flank Pain This is a new problem. The current episode started yesterday. The problem occurs constantly. The problem has been unchanged. Associated symptoms include nausea and vomiting. Pertinent negatives include no abdominal pain, change in bowel habit, chest pain, fever or urinary symptoms. The symptoms are aggravated by bending. He has tried nothing for the symptoms.    Past Medical History  Diagnosis Date  . Renal disorder     kidney stones   Past Surgical History  Procedure Laterality Date  . Cardiac surgery      childhood   No family history on file. History  Substance Use Topics  . Smoking status: Never Smoker   . Smokeless tobacco: Not on file  . Alcohol Use: No    Review of Systems  Constitutional: Negative for fever and appetite change.  Cardiovascular: Negative for chest pain and palpitations.  Gastrointestinal: Positive for nausea and vomiting. Negative for abdominal pain, diarrhea, constipation, blood in stool, abdominal distention and change in bowel habit.  Genitourinary: Positive for flank pain. Negative for dysuria, hematuria, penile swelling, scrotal swelling, penile pain and testicular pain.  All other systems reviewed and are negative.    Allergies  Review of patient's allergies indicates no known allergies.  Home Medications   Current Outpatient Rx  Name  Route  Sig  Dispense  Refill  . clonazePAM (KLONOPIN) 0.5 MG tablet   Oral   Take 0.5 mg by mouth 3 (three) times daily.          Marland Kitchen FLUoxetine (PROZAC) 20 MG capsule   Oral   Take 20 mg by mouth  daily.         . ondansetron (ZOFRAN) 4 MG tablet   Oral   Take 4 mg by mouth every 6 (six) hours as needed for nausea.          BP 113/70  Pulse 81  Temp(Src) 98.2 F (36.8 C) (Oral)  Resp 18  SpO2 98% Physical Exam  Vitals reviewed. Constitutional: He is oriented to person, place, and time. He appears well-developed and well-nourished. No distress.  HENT:  Head: Atraumatic.  Eyes: Conjunctivae are normal.  Cardiovascular: Normal rate, regular rhythm, normal heart sounds and intact distal pulses.   Pulmonary/Chest: Effort normal and breath sounds normal.  Abdominal: Soft. Bowel sounds are normal. He exhibits no distension. There is no tenderness.  Musculoskeletal:       Cervical back: Normal.       Thoracic back: Normal.       Lumbar back: He exhibits tenderness. He exhibits normal range of motion, no bony tenderness, no swelling and no deformity.  Neurological: He is alert and oriented to person, place, and time.  Skin: Skin is warm and dry.    ED Course  Procedures (including critical care time) Labs Review Labs Reviewed  CBC WITH DIFFERENTIAL - Abnormal; Notable for the following:    MCH 34.1 (*)    MCHC 36.6 (*)    Platelets 137 (*)    Neutrophils Relative % 85 (*)    Neutro Abs 7.8 (*)    Lymphocytes Relative 9 (*)  All other components within normal limits  COMPREHENSIVE METABOLIC PANEL - Abnormal; Notable for the following:    Glucose, Bld 108 (*)    GFR calc non Af Amer 63 (*)    GFR calc Af Amer 73 (*)    All other components within normal limits  URINALYSIS, ROUTINE W REFLEX MICROSCOPIC - Abnormal; Notable for the following:    Ketones, ur 15 (*)    All other components within normal limits   Imaging Review No results found.  MDM   1. Lumbar back pain    57 y/o male with history of nephrolithiasis presenting with bilateral cramping nonradiating lumbar back pain. No midline pain or history of trauma. No bowel/bladder incontinence, saddle  anesthesia, LE weakness. One episode of nausea and diaphoresis with emesis yesterday. No further emesis. No dysuria or hematuria. UA with small ketones but no blood. Doubt nephrolithiasis. Recommend symptomatic management of likely muscular pain and f/u with PCP. Return precautions discussed and patient voiced understanding.   Labs reviewed in my medical decision making. Patient discussed with my attending, Dr. Derek Jack, MD 06/30/13 [phone]

## 2013-06-29 NOTE — ED Notes (Signed)
Alert, NAD, calm, interactive, denies pain at this time, attempting urine sample.

## 2013-07-12 NOTE — ED Provider Notes (Signed)
I saw and evaluated the patient, reviewed the resident's note and I agree with the findings and plan.   .Face to face Exam:  General:  Awake HEENT:  Atraumatic Resp:  Normal effort Abd:  Nondistended Neuro:No focal weakness  Nelia Shi, MD 07/12/13 [phone]

## 2013-10-14 IMAGING — CT CT HEAD W/O CM
1 of 2 series · 16 of 30 positions shown, 20 images · non-contrast
Comparison: None.

CLINICAL DATA: Dizziness, nausea.

CT HEAD WITHOUT CONTRAST
TECHNIQUE: Contiguous axial images were obtained from the base of
the skull through the vertex without contrast.

[Series 2: head routine 4.8 h37s · axial · 0.38mm/px · z∈[-72,+35]mm · 16 of 24 slices shown, 20 images]
[im 2/24  brain]
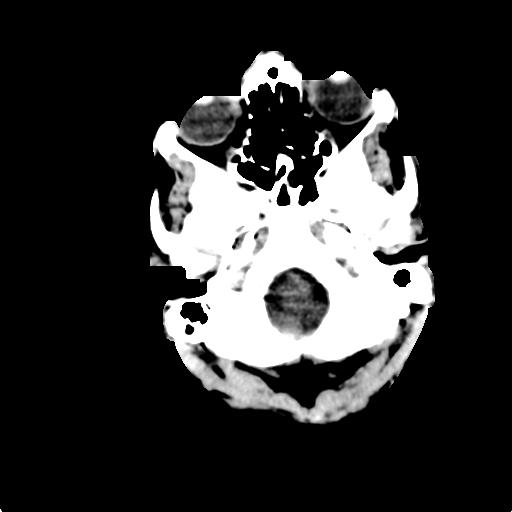
[im 2/24  bone]
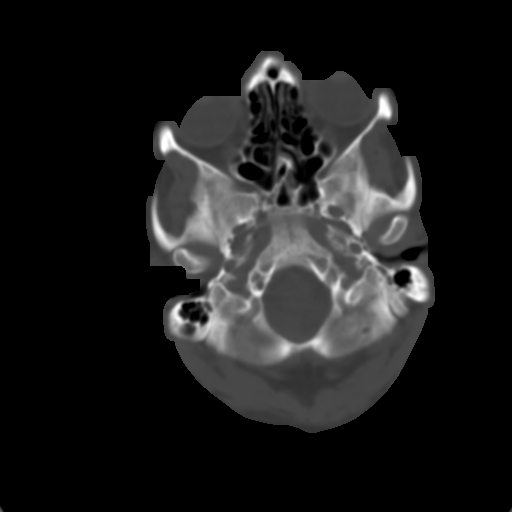
[im 4/24  brain]
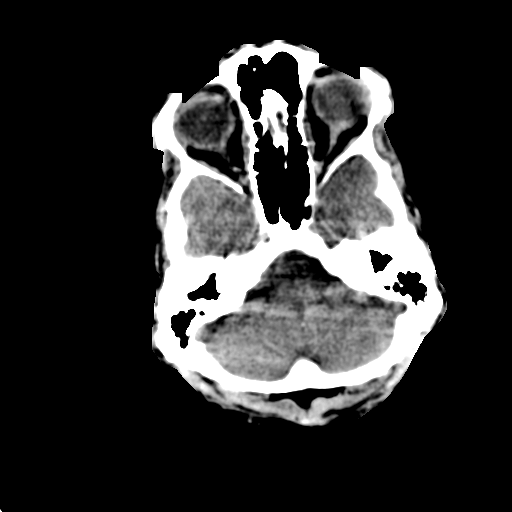
[im 5/24  brain]
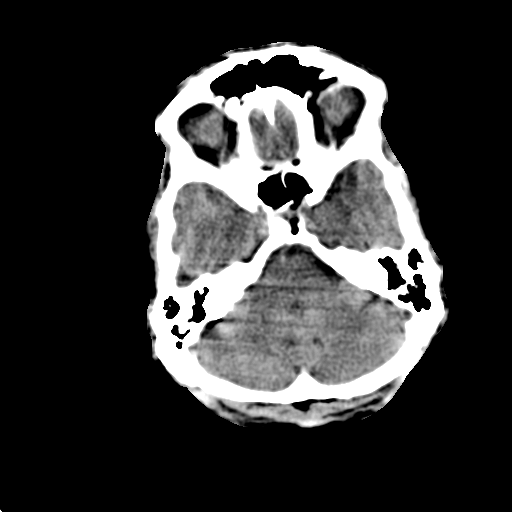
[im 6/24  brain]
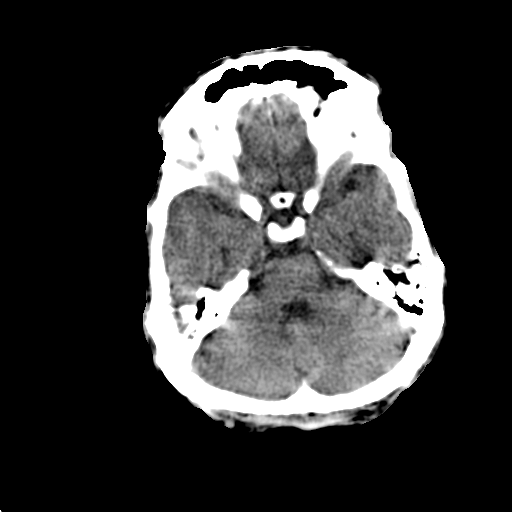
[im 8/24  brain]
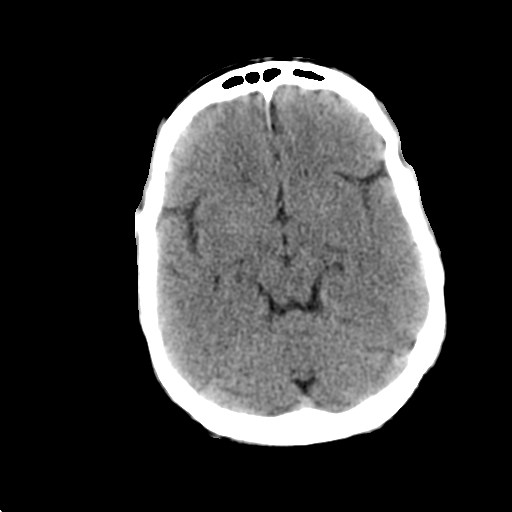
[im 8/24  bone]
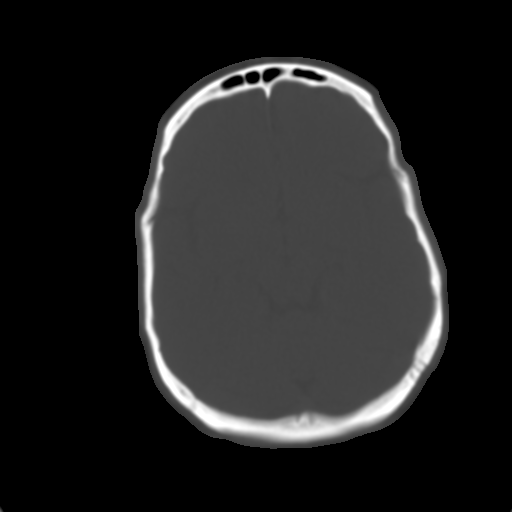
[im 9/24  brain]
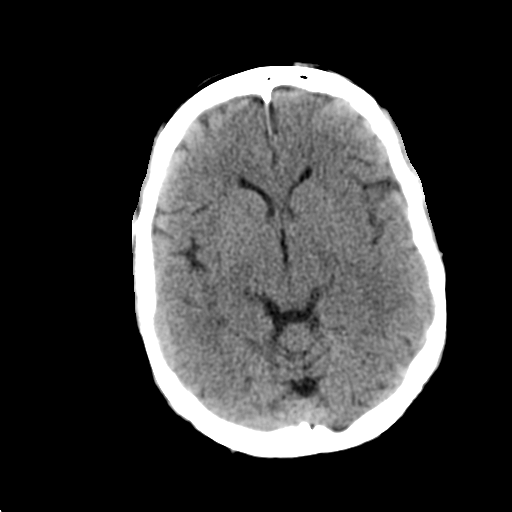
[im 10/24  brain]
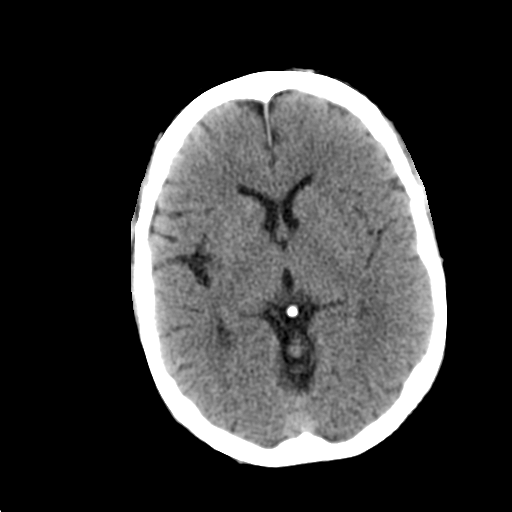
[im 12/24  brain]
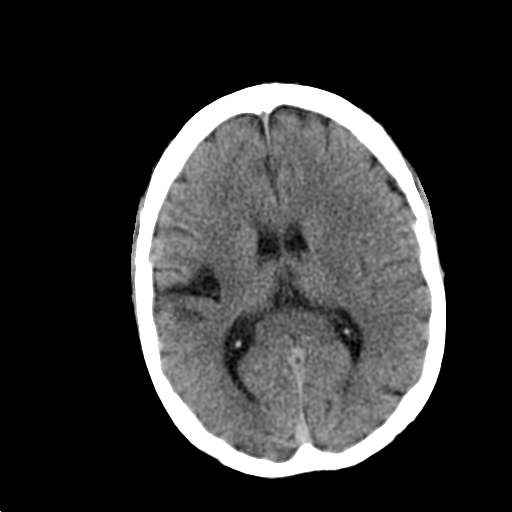
[im 13/24  brain]
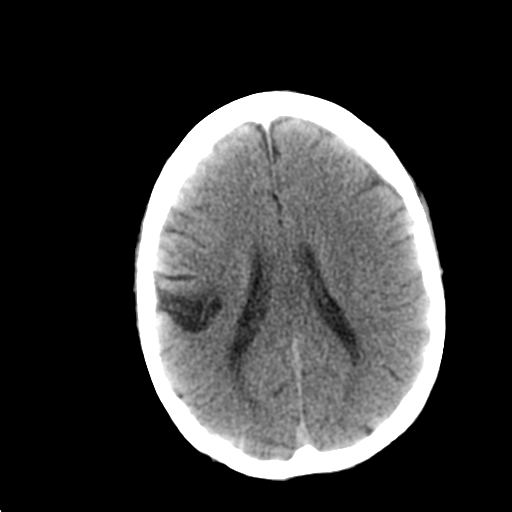
[im 13/24  bone]
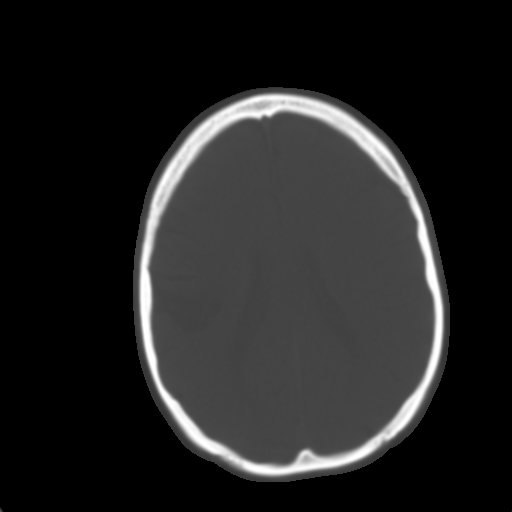
[im 15/24  brain]
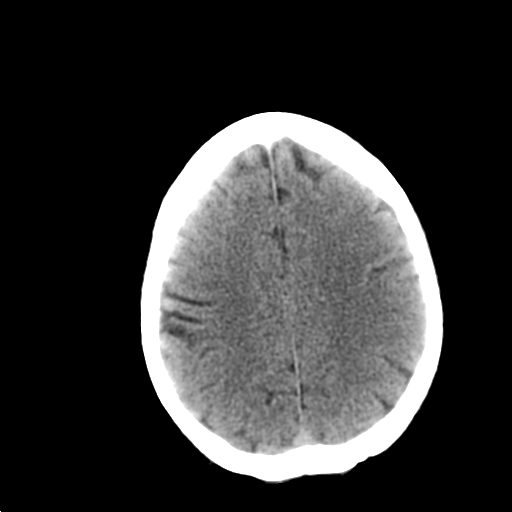
[im 16/24  brain]
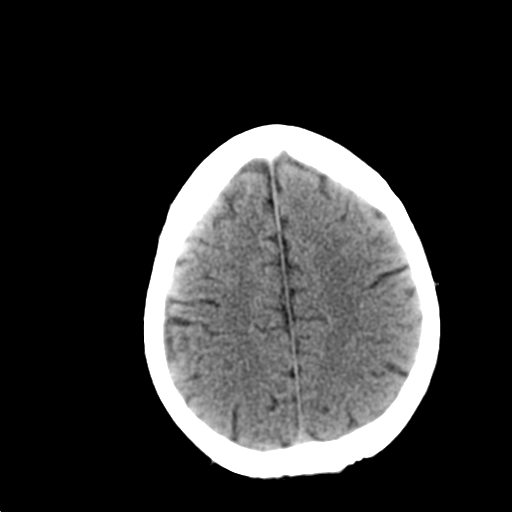
[im 17/24  brain]
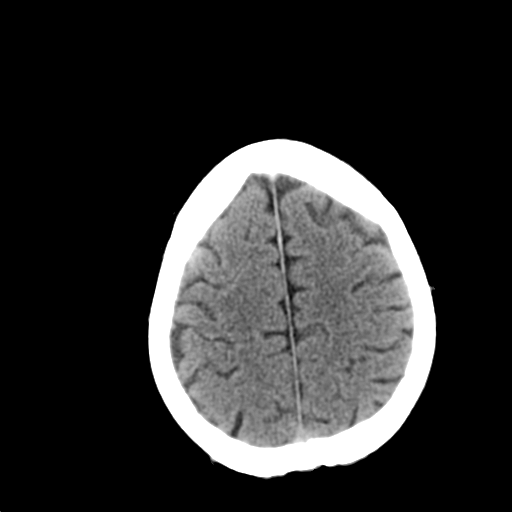
[im 19/24  brain]
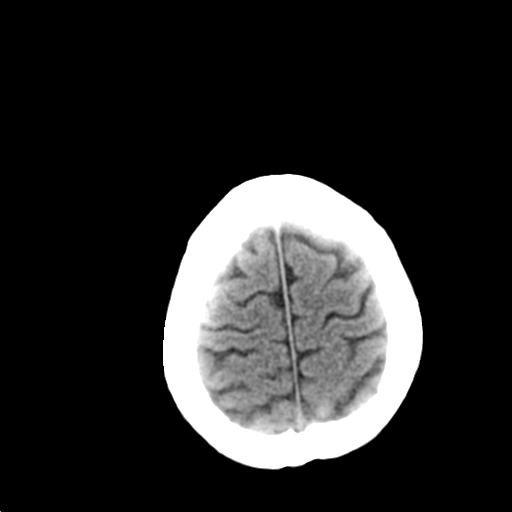
[im 19/24  bone]
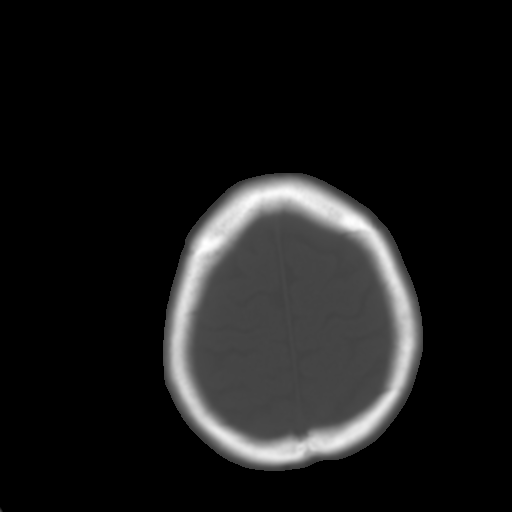
[im 20/24  brain]
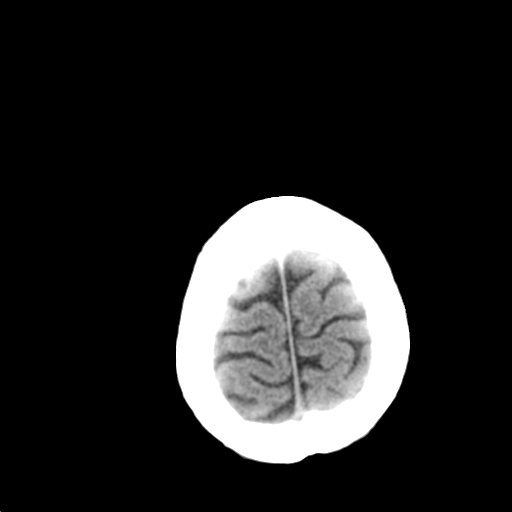
[im 21/24  brain]
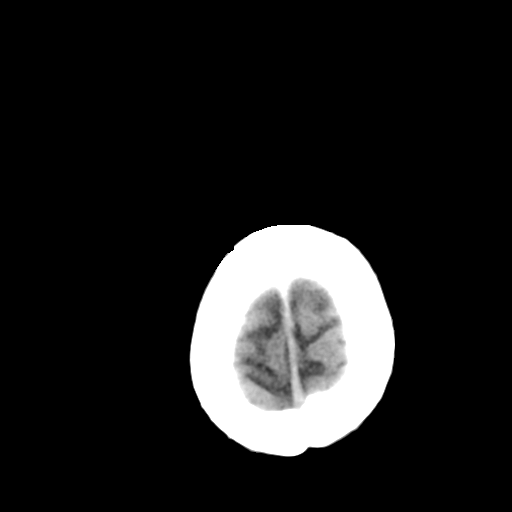
[im 23/24  brain]
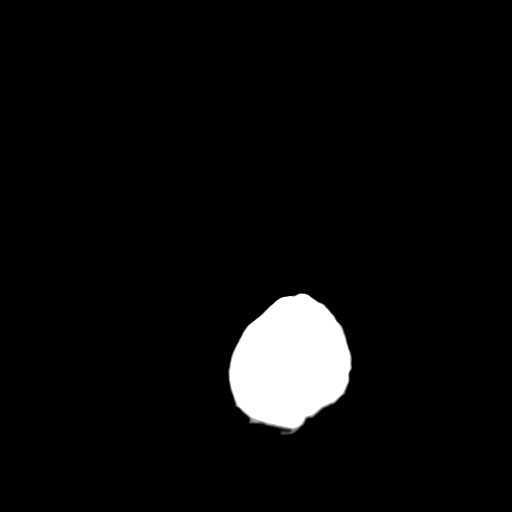

[16 of 30 positions shown; findings below may reference images not displayed]

FINDINGS: Suspect old infarct in the right parietal and posterior
temporal lobe. No acute intracranial abnormality.  Specifically, no
hemorrhage, hydrocephalus, mass lesion, acute infarction, or
significant intracranial injury.  No acute calvarial abnormality.
Visualized paranasal sinuses and mastoids clear.  Orbital soft
tissues unremarkable.
IMPRESSION: No acute intracranial abnormality.

## 2016-05-27 ENCOUNTER — Encounter (HOSPITAL_COMMUNITY): Payer: Self-pay | Admitting: Vascular Surgery

## 2016-05-27 ENCOUNTER — Emergency Department (HOSPITAL_COMMUNITY)
Admission: EM | Admit: 2016-05-27 | Discharge: 2016-05-27 | Disposition: A | Discharge status: Home / Self Care | Payer: Medicare Other | Attending: Emergency Medicine | Admitting: Emergency Medicine

## 2016-05-27 DIAGNOSIS — M545 Low back pain: Secondary | ICD-10-CM

## 2016-05-27 LAB — URINALYSIS, ROUTINE W REFLEX MICROSCOPIC
BILIRUBIN URINE: NEGATIVE
Glucose, UA: NEGATIVE mg/dL
Hgb urine dipstick: NEGATIVE
Ketones, ur: NEGATIVE mg/dL
LEUKOCYTES UA: NEGATIVE
NITRITE: NEGATIVE
PH: 5.5 (ref 5.0–8.0)
Protein, ur: NEGATIVE mg/dL
SPECIFIC GRAVITY, URINE: 1.023 (ref 1.005–1.030)

## 2016-05-27 MED ORDER — CYCLOBENZAPRINE HCL 10 MG PO TABS
5.0000 mg | ORAL_TABLET | Freq: Once | ORAL | Status: AC
  Administered 2016-05-27: 5 mg via ORAL
  Filled 2016-05-27: qty 1

## 2016-05-27 MED ORDER — CYCLOBENZAPRINE HCL 5 MG PO TABS: 10.0000 mg | ORAL_TABLET | Freq: Three times a day (TID) | ORAL | 0 refills | Status: AC | PRN

## 2016-05-27 MED ORDER — ACETAMINOPHEN 325 MG PO TABS
650.0000 mg | ORAL_TABLET | Freq: Once | ORAL | Status: AC
  Administered 2016-05-27: 650 mg via ORAL
  Filled 2016-05-27: qty 2

## 2016-05-27 NOTE — ED Notes (Signed)
Pt stable, ambulatory, states understanding of discharge instructions 

## 2016-05-27 NOTE — ED Provider Notes (Signed)
[facility]-EMERGENCY DEPT Provider Note   CSN: [id number] Arrival date & time: 05/27/16  1817  By signing my name below, I, Linna DarnerRussell Turner, attest that this documentation has been prepared under the direction and in the presence of Arvilla MeresAshley Meyer, PA-C. Electronically Signed: Linna Darnerussell Turner, Scribe. 05/27/2016. 7:06 PM.  History   Chief Complaint Chief Complaint  Patient presents with  . Back Pain    The history is provided by the patient. No language interpreter was used.     HPI Comments: [name] is a 60 y.o. male who presents to the Emergency Department complaining of sudden onset, constant, sharp, pain across his bilateral lower back for the last 3-4 days. He states his pain is non-radiating and endorses pain exacerbation with certain positions, especially sitting upright and standing up. He denies recent trauma to his back, activity change, or heavy lifting. He reports he is not currently employed. Pt notes a h/o kidney stones and states his current pain is not as significant and is different from the pain he experienced with kidney stones. He has used ibuprofen with no relief of his pain; his last dose was yesterday. He denies h/o immunocompromised conditions, h/o IV drug use, or current steroid use. He denies vision changes, nausea, vomiting, abdominal pain, hematuria, dysuria, rash, fever, numbness/weakness of his lower extremities, saddle anesthesia, bowel/bladder incontinence, urinary retention, neuro deficits, hematuria, dysuria, or any other associated symptoms.   PCP: Dr. Mikeal HawthorneGarba  Past Medical History:  Diagnosis Date  . Renal disorder    kidney stones    There are no active problems to display for this patient.   Past Surgical History:  Procedure Laterality Date  . CARDIAC SURGERY     childhood       Home Medications    Prior to Admission medications   Medication Sig Start Date End Date Taking? Authorizing Provider  clonazePAM (KLONOPIN) 0.5 MG tablet Take  0.5 mg by mouth 3 (three) times daily.     Historical Provider, MD  cyclobenzaprine (FLEXERIL) 5 MG tablet Take 2 tablets (10 mg total) by mouth 3 (three) times daily as needed for muscle spasms. 05/27/16   Lona KettleAshley Laurel Meyer, PA-C  FLUoxetine (PROZAC) 20 MG capsule Take 20 mg by mouth daily.    Historical Provider, MD  ibuprofen (ADVIL) 200 MG tablet Take 3 tablets (600 mg total) by mouth every 6 (six) hours as needed for pain. 06/29/13   Abagail KitchensMegan Taylor, MD  ondansetron (ZOFRAN) 4 MG tablet Take 4 mg by mouth every 6 (six) hours as needed for nausea.    Historical Provider, MD    Family History History reviewed. No pertinent family history.  Social History Social History  Substance Use Topics  . Smoking status: Never Smoker  . Smokeless tobacco: Never Used  . Alcohol use No     Allergies   Review of patient's allergies indicates no known allergies.   Review of Systems Review of Systems  Constitutional: Negative for fever.  Eyes: Negative for visual disturbance.  Gastrointestinal: Negative for abdominal pain, nausea and vomiting.       Negative for bowel incontinence.  Genitourinary: Negative for dysuria and hematuria.       Negative for bladder incontinence.  Musculoskeletal: Positive for back pain (bilateral lower).  Skin: Negative for rash.  Neurological: Negative for weakness and numbness.       Negative for saddle anesthesia.    Physical Exam Updated Vital Signs BP 152/89   Pulse 71   Temp 98 F (  36.7 C) (Oral)   Resp 16   SpO2 100%   Physical Exam  Constitutional: He appears well-developed and well-nourished. No distress.  HENT:  Head: Normocephalic and atraumatic.  Mouth/Throat: Oropharynx is clear and moist.  Eyes: Conjunctivae and EOM are normal. Pupils are equal, round, and reactive to light. Right eye exhibits no discharge. Left eye exhibits no discharge.  Neck: Normal range of motion. Neck supple. No tracheal deviation present.  Cardiovascular: Normal  rate and intact distal pulses.   Pulmonary/Chest: Effort normal. No respiratory distress.  Abdominal: He exhibits no distension.  Musculoskeletal: Normal range of motion.  No obvious deformity. No TTP of C-, T-, L- spine. No step off. Mild TTP of b/l lumbar paravertebral muscles. ROM intact.   Neurological: He is alert. He is not disoriented. He displays a negative Romberg sign. Coordination and gait normal. GCS eye subscore is 4. GCS verbal subscore is 5. GCS motor subscore is 6.  Mental Status:  Alert, thought content appropriate, able to give a coherent history. Speech fluent without evidence of aphasia. Able to follow 2 step commands without difficulty.  Cranial Nerves:  II:  Peripheral visual fields grossly normal, pupils equal, round, reactive to light III,IV, VI: ptosis not present, extra-ocular motions intact bilaterally  V,VII: smile symmetric, facial light touch sensation equal VIII: hearing grossly normal to voice  X: uvula elevates symmetrically  XI: bilateral shoulder shrug symmetric and strong XII: midline tongue extension without fassiculations Motor:  Normal tone. 5/5 in upper and lower extremities bilaterally including strong and equal grip strength and dorsiflexion/plantar flexion Sensory: light touch normal in all extremities. DTRs: biceps and patellar 2+ symmetric b/l Cerebellar: normal finger-to-nose with bilateral upper extremities Gait: normal gait and balance. Patient able to walk on toes and walk on heels.  CV: distal pulses palpable throughout   Skin: Skin is warm and dry.  Psychiatric: He has a normal mood and affect. His behavior is normal.  Nursing note and vitals reviewed.   ED Treatments / Results  Labs (all labs ordered are listed, but only abnormal results are displayed) Labs Reviewed  URINALYSIS, ROUTINE W REFLEX MICROSCOPIC (NOT AT [facility])    EKG  EKG Interpretation None       Radiology No results found.  Procedures Procedures (including  critical care time)  DIAGNOSTIC STUDIES: Oxygen Saturation is 100% on RA, normal by my interpretation.    COORDINATION OF CARE: 7:06 PM Discussed treatment plan with pt at bedside and pt agreed to plan.  Medications Ordered in ED Medications  acetaminophen (TYLENOL) tablet 650 mg (650 mg Oral Given 05/27/16 1936)  cyclobenzaprine (FLEXERIL) tablet 5 mg (5 mg Oral Given 05/27/16 1936)     Initial Impression / Assessment and Plan / ED Course  I have reviewed the triage vital signs and the nursing notes.  Pertinent labs & imaging results that were available during my care of the patient were reviewed by me and considered in my medical decision making (see chart for details).  Clinical Course    Patient with back pain.  Patient is afebrile and non-toxic appearing in NAD. Vital signs remarkable for mild increase BP, otherwise stable. No TTP of spine. Mild TTP of b/l lumbar paravertebral muscles. No neurological deficits and normal neuro exam.  Patient can walk with steady gait, able to walk on toes and heels.  No loss of bowel or bladder control.  No concern for cauda equina.  No fever, night sweats, weight loss, h/o cancer, IVDU. Low suspicion for  epidural abscess. U/A negative for infection. RICE protocol and pain medicine indicated and discussed with patient. Suspect MSK in nature. Rx flexeril. Encouraged follow up with PCP if sxs persist. Return precautions given. Patient voiced understanding and is agreeable.   I personally performed the services described in this documentation, which was scribed in my presence. The recorded information has been reviewed and is accurate.   Final Clinical Impressions(s) / ED Diagnoses   Final diagnoses:  Bilateral low back pain, with sciatica presence unspecified    New Prescriptions Discharge Medication List as of 05/27/2016  9:12 PM    START taking these medications   Details  cyclobenzaprine (FLEXERIL) 5 MG tablet Take 2 tablets (10 mg total) by  mouth 3 (three) times daily as needed for muscle spasms., Starting Fri 05/27/2016, Print         [place], PA-C 05/28/16 1610    Linwood Dibbles, MD 05/28/16 [phone]

## 2016-05-27 NOTE — ED Triage Notes (Addendum)
Pt reports to the ED for eval of low back pain x several days. Denies any known injury. Pt denies any numbness, tingling, paralysis, or bowel or bladder changes. Pain is worse with movement and bending

## 2016-05-27 NOTE — Discharge Instructions (Signed)
Read the information below.   Your urine did not show signs of infection. Your back pain may be musculoskeletal in nature. You can take tylenol 650mg  every 6hrs or motrin 400mg  every 6hrs for pain relief. I am prescribing a muscle relaxer for muscle pain. This can make you drowsy, do not drive after taking.  Use the prescribed medication as directed.  Please discuss all new medications with your pharmacist.    I have provided resources for exercises that may help alleviate your pain.  If your symptoms persist for more than 1 week follow up with your primary care provider Dr. Mikeal HawthorneGarba for re-evaluation.  You may return to the Emergency Department at any time for worsening condition or any new symptoms that concern you. Return to the ED if you develop fever, loss of bowel or bladder, numbness/weakness.

## 2018-07-19 ENCOUNTER — Other Ambulatory Visit: Payer: Self-pay | Admitting: Otolaryngology

## 2018-07-19 DIAGNOSIS — H905 Unspecified sensorineural hearing loss: Secondary | ICD-10-CM

## 2018-07-19 DIAGNOSIS — H903 Sensorineural hearing loss, bilateral: Secondary | ICD-10-CM
